# Patient Record
Sex: Female | Born: 1950 | Race: White | Hispanic: No | Marital: Married | Smoking: Never smoker
Health system: Southern US, Community
[De-identification: ages and names within clinical notes are randomized; demographics above are authoritative.]

## PROBLEM LIST (undated history)

## (undated) DIAGNOSIS — J45909 Unspecified asthma, uncomplicated: Secondary | ICD-10-CM

## (undated) DIAGNOSIS — I10 Essential (primary) hypertension: Secondary | ICD-10-CM

## (undated) DIAGNOSIS — D869 Sarcoidosis, unspecified: Secondary | ICD-10-CM

## (undated) DIAGNOSIS — R079 Chest pain, unspecified: Secondary | ICD-10-CM

## (undated) DIAGNOSIS — J302 Other seasonal allergic rhinitis: Secondary | ICD-10-CM

## (undated) DIAGNOSIS — Z8739 Personal history of other diseases of the musculoskeletal system and connective tissue: Secondary | ICD-10-CM

## (undated) DIAGNOSIS — F329 Major depressive disorder, single episode, unspecified: Secondary | ICD-10-CM

## (undated) DIAGNOSIS — M47814 Spondylosis without myelopathy or radiculopathy, thoracic region: Secondary | ICD-10-CM

## (undated) DIAGNOSIS — K589 Irritable bowel syndrome without diarrhea: Secondary | ICD-10-CM

## (undated) DIAGNOSIS — E559 Vitamin D deficiency, unspecified: Secondary | ICD-10-CM

## (undated) DIAGNOSIS — E119 Type 2 diabetes mellitus without complications: Secondary | ICD-10-CM

## (undated) DIAGNOSIS — R233 Spontaneous ecchymoses: Secondary | ICD-10-CM

## (undated) DIAGNOSIS — Z9889 Other specified postprocedural states: Secondary | ICD-10-CM

## (undated) DIAGNOSIS — K219 Gastro-esophageal reflux disease without esophagitis: Secondary | ICD-10-CM

## (undated) DIAGNOSIS — G4733 Obstructive sleep apnea (adult) (pediatric): Secondary | ICD-10-CM

## (undated) DIAGNOSIS — M722 Plantar fascial fibromatosis: Secondary | ICD-10-CM

## (undated) DIAGNOSIS — E785 Hyperlipidemia, unspecified: Secondary | ICD-10-CM

## (undated) DIAGNOSIS — R112 Nausea with vomiting, unspecified: Secondary | ICD-10-CM

## (undated) DIAGNOSIS — J4550 Severe persistent asthma, uncomplicated: Secondary | ICD-10-CM

## (undated) DIAGNOSIS — R238 Other skin changes: Secondary | ICD-10-CM

## (undated) HISTORY — DX: Other skin changes: R23.8

## (undated) HISTORY — DX: Unspecified asthma, uncomplicated: J45.909

## (undated) HISTORY — DX: Chest pain, unspecified: R07.9

## (undated) HISTORY — DX: Plantar fascial fibromatosis: M72.2

## (undated) HISTORY — PX: LESION EXCISION: SHX5167

## (undated) HISTORY — DX: Other seasonal allergic rhinitis: J30.2

## (undated) HISTORY — PX: BREAST BIOPSY: SHX20

## (undated) HISTORY — DX: Irritable bowel syndrome, unspecified: K58.9

## (undated) HISTORY — DX: Spontaneous ecchymoses: R23.3

## (undated) HISTORY — DX: Sarcoidosis, unspecified: D86.9

---

## 1979-04-22 HISTORY — PX: TUBAL LIGATION: SHX77

## 1984-04-21 HISTORY — PX: LUMBAR SPINE SURGERY: SHX701

## 1984-04-21 HISTORY — PX: OTHER SURGICAL HISTORY: SHX169

## 1996-04-21 HISTORY — PX: MEDIASTINOSCOPY: SUR861

## 1999-04-22 HISTORY — PX: CARPAL TUNNEL RELEASE: SHX101

## 2010-06-20 ENCOUNTER — Ambulatory Visit: Payer: Self-pay | Admitting: Otolaryngology

## 2013-01-26 ENCOUNTER — Encounter: Payer: Self-pay | Admitting: Podiatrist

## 2013-01-27 ENCOUNTER — Encounter: Payer: Self-pay | Admitting: Podiatrist

## 2013-01-27 ENCOUNTER — Ambulatory Visit (INDEPENDENT_AMBULATORY_CARE_PROVIDER_SITE_OTHER): Payer: BC Managed Care – PPO | Admitting: Podiatrist

## 2013-01-27 VITALS — BP 149/87 | HR 64 | Temp 98.3°F | Resp 16 | Ht 64.0 in | Wt 212.0 lb

## 2013-01-27 DIAGNOSIS — M799 Soft tissue disorder, unspecified: Secondary | ICD-10-CM

## 2013-01-27 NOTE — Progress Notes (Signed)
Subjective:  Patient presents for post op follow up regarding removal of lesions left third and fourth toes. Patient states the left third toe plantarly is doing great however the left fourth toe she feels that lesion may have returned. Patient states it's mildly uncomfortable however she is able to tolerate it at this time Objective: Her vascular status is intact and unchanged. Excellent appearance plantarly the third toe is noted. Slight lesion palpated plantar lateral aspect of left fourth toe which does appear to be the head of the proximal phalanx when plantarflexed. Appears to be a friction lesion at this time. Assessment status post removal soft tissue lesions left third and fourth toes plantarly Plan: Recommended use of an offloading pad on the left fourth toe. Discussed that if lesion returns would most likely need an arthroplasty to straighten the toe and decreased range of motion in friction from the proximal phalanx. She will be seen back on as-needed basis if she has any problems or concerns she'll let me know.  Marlowe Aschoff DPM

## 2013-06-23 ENCOUNTER — Ambulatory Visit: Payer: Self-pay

## 2015-02-12 IMAGING — CR DG CHEST 2V
1 series · 2 of 2 positions shown · non-contrast
Comparison: None.

CLINICAL DATA: Cough, chest tightness

EXAM:
CHEST  2 VIEW

[Series 1: pa · 0.17mm/px · 2 of 2 slices shown]
[im 1/2]
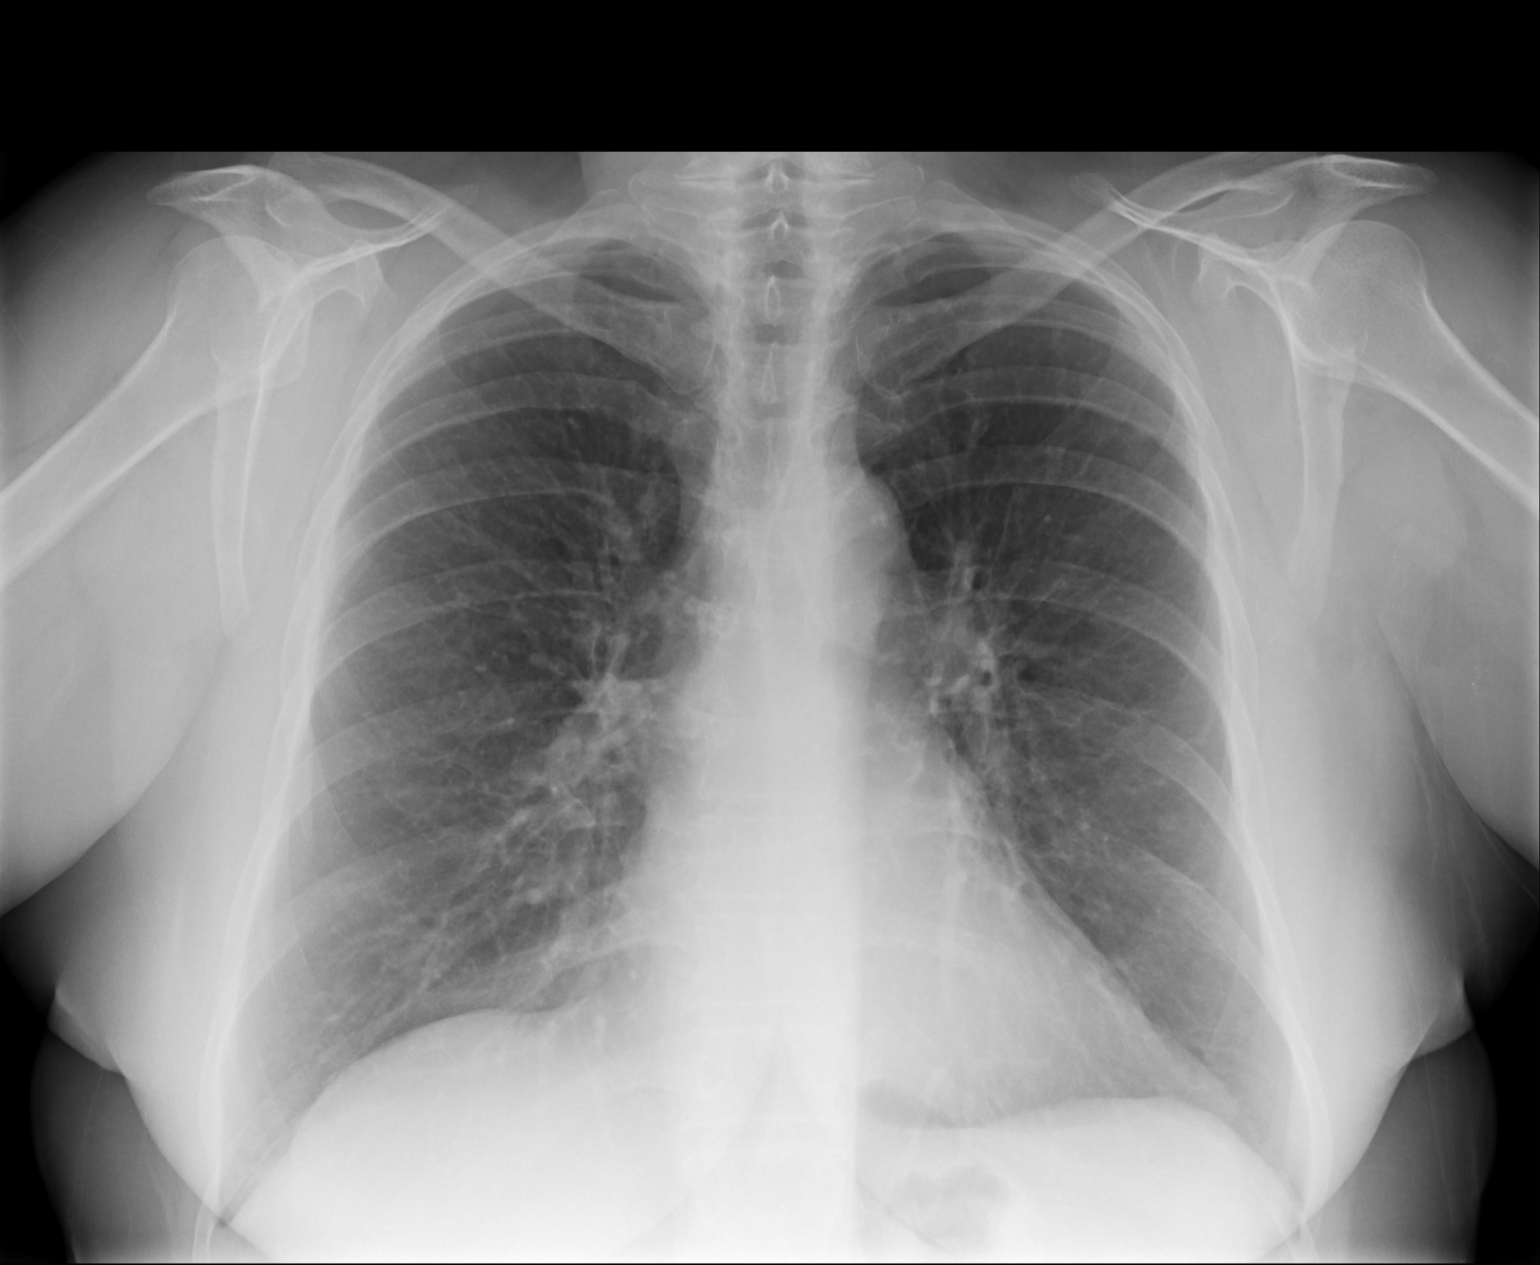
[im 2/2]
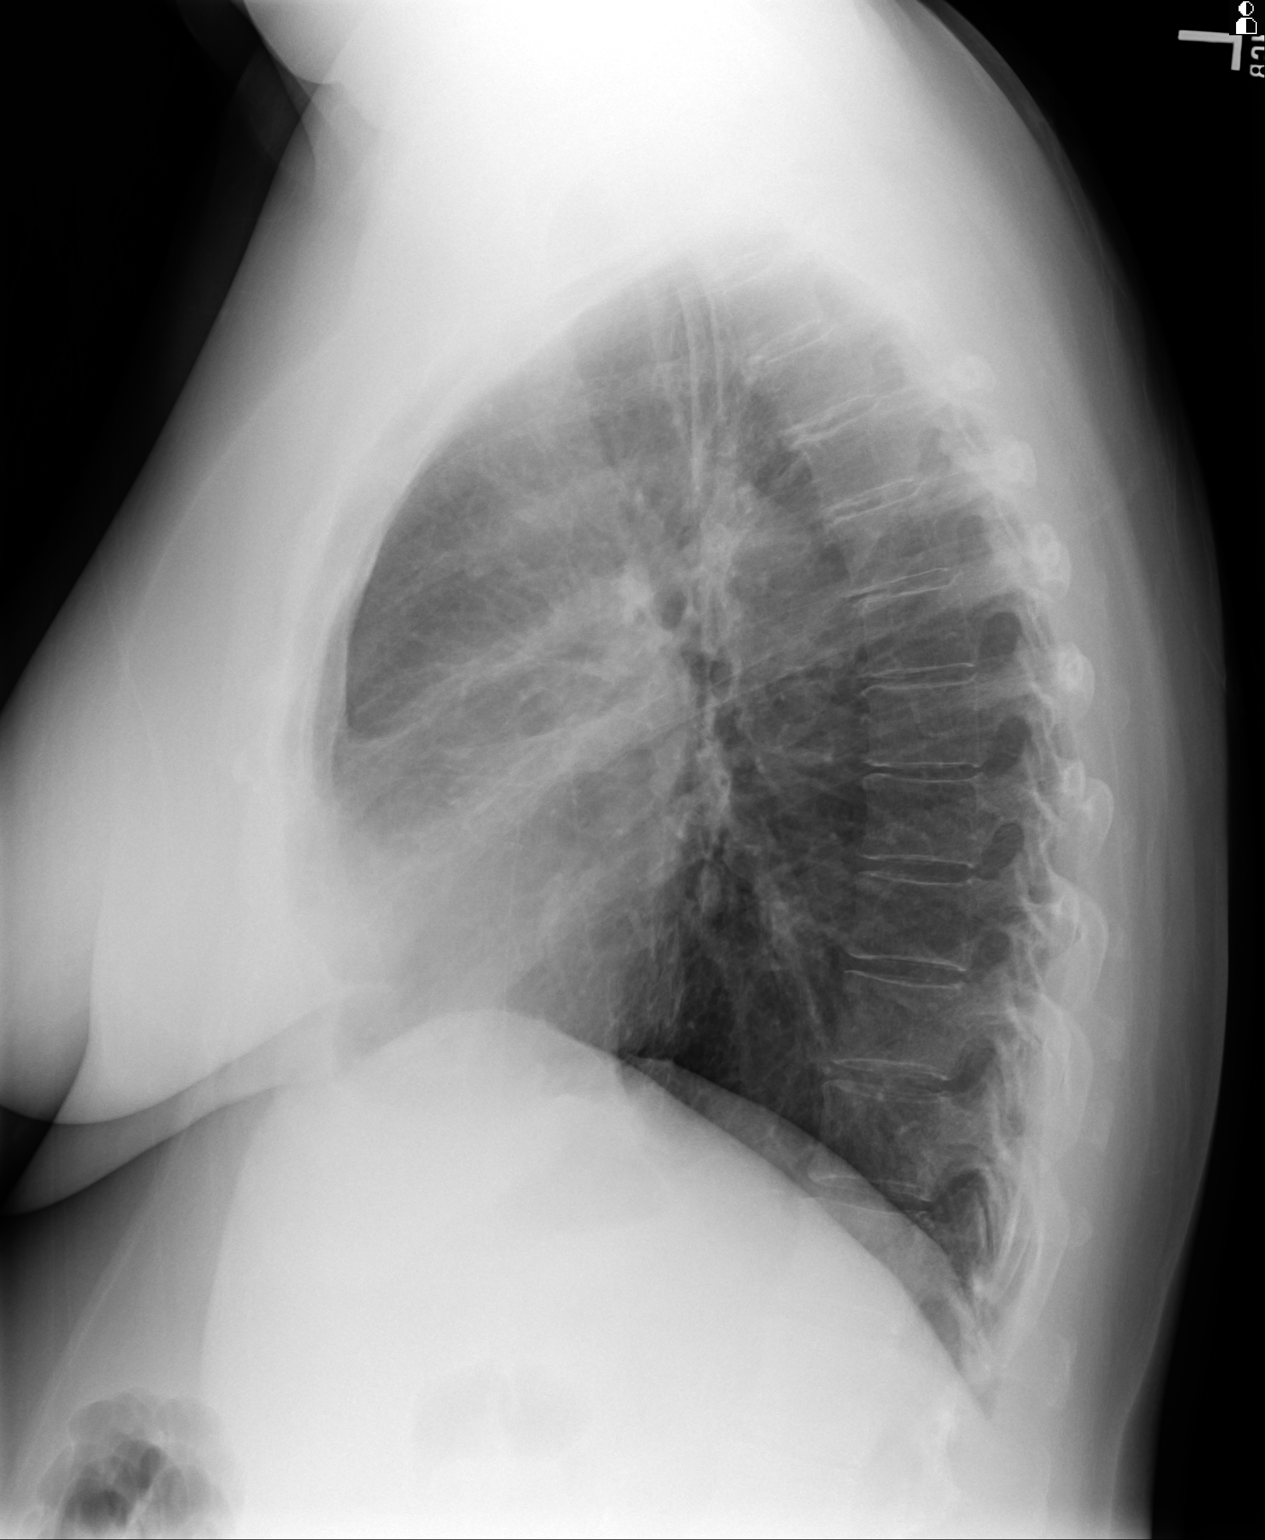

[2 of 2 positions shown; findings below may reference images not displayed]

FINDINGS: The heart size and mediastinal contours are within normal limits.
Both lungs are clear. The visualized skeletal structures are
unremarkable.
IMPRESSION: No active cardiopulmonary disease.

## 2017-04-21 HISTORY — PX: CERVICAL FUSION: SHX112

## 2021-02-19 DIAGNOSIS — J189 Pneumonia, unspecified organism: Secondary | ICD-10-CM

## 2021-02-19 HISTORY — DX: Pneumonia, unspecified organism: J18.9

## 2022-04-23 ENCOUNTER — Other Ambulatory Visit: Payer: Self-pay | Admitting: Orthopedic Surgery

## 2022-04-24 ENCOUNTER — Encounter
Admission: RE | Admit: 2022-04-24 | Discharge: 2022-04-24 | Disposition: A | Discharge status: Home / Self Care | Payer: Medicare Other | Source: Ambulatory Visit | Attending: Orthopedic Surgery | Admitting: Orthopedic Surgery

## 2022-04-24 VITALS — Ht 64.0 in | Wt 220.0 lb

## 2022-04-24 DIAGNOSIS — Z01812 Encounter for preprocedural laboratory examination: Secondary | ICD-10-CM

## 2022-04-24 DIAGNOSIS — I10 Essential (primary) hypertension: Secondary | ICD-10-CM

## 2022-04-24 DIAGNOSIS — E119 Type 2 diabetes mellitus without complications: Secondary | ICD-10-CM

## 2022-04-24 HISTORY — DX: Major depressive disorder, single episode, unspecified: F32.9

## 2022-04-24 HISTORY — DX: Essential (primary) hypertension: I10

## 2022-04-24 HISTORY — DX: Type 2 diabetes mellitus without complications: E11.9

## 2022-04-24 HISTORY — DX: Hyperlipidemia, unspecified: E78.5

## 2022-04-24 HISTORY — DX: Gastro-esophageal reflux disease without esophagitis: K21.9

## 2022-04-24 HISTORY — DX: Other specified postprocedural states: Z98.890

## 2022-04-24 HISTORY — DX: Vitamin D deficiency, unspecified: E55.9

## 2022-04-24 HISTORY — DX: Spondylosis without myelopathy or radiculopathy, thoracic region: M47.814

## 2022-04-24 HISTORY — DX: Severe persistent asthma, uncomplicated: J45.50

## 2022-04-24 HISTORY — DX: Personal history of other diseases of the musculoskeletal system and connective tissue: Z87.39

## 2022-04-24 HISTORY — DX: Nausea with vomiting, unspecified: R11.2

## 2022-04-24 HISTORY — DX: Obstructive sleep apnea (adult) (pediatric): G47.33

## 2022-04-24 NOTE — Patient Instructions (Addendum)
Your procedure is scheduled on: Thursday, January 18 Report to the Registration Desk on the 1st floor of the [facility]. To find out your arrival time, please call [phone] between 1PM - 3PM on: Wednesday, January 17 If your arrival time is 6:00 am, do not arrive prior to that time as the [facility] entrance doors do not open until 6:00 am.  REMEMBER: Instructions that are not followed completely may result in serious medical risk, up to and including death; or upon the discretion of your surgeon and anesthesiologist your surgery may need to be rescheduled.  Do not eat or drink after midnight the night before surgery.  No gum chewing, lozengers or hard candies.  TAKE THESE MEDICATIONS THE MORNING OF SURGERY WITH A SIP OF WATER:  Amlodipine Trelegy inhaler Gabapentin Duoneb nebulizer Omeprazole (Prilosec) - (take one the night before and one on the morning of surgery - helps to prevent nausea after surgery.)  Use inhalers on the day of surgery and bring your albuterol inhaler to the hospital.  Metformin - hold for 2 days prior to surgery. Last day to take metformin is Monday, January 15. Resume AFTER surgery.  One week prior to surgery: starting January 11 Stop Anti-inflammatories (NSAIDS) such as Advil, Aleve, Ibuprofen, Motrin, Naproxen, Naprosyn and Aspirin based products such as Excedrin, Goodys Powder, BC Powder. Stop ANY OVER THE COUNTER supplements until after surgery. Stop coenzyme Q10, cranberry, probiotic You may however, continue to take Tylenol if needed for pain up until the day of surgery.  Continue all other prescribed medication up until the day of surgery.  No Alcohol for 24 hours before or after surgery.  No Smoking including e-cigarettes for 24 hours prior to surgery.  No chewable tobacco products for at least 6 hours prior to surgery.  No nicotine patches on the day of surgery.  Do not use any "recreational" drugs for at least a week prior to your  surgery.  Please be advised that the combination of cocaine and anesthesia may have negative outcomes, up to and including death. If you test positive for cocaine, your surgery will be cancelled.  On the morning of surgery brush your teeth with toothpaste and water, you may rinse your mouth with mouthwash if you wish. Do not swallow any toothpaste or mouthwash.  Use CHG Soap as directed on instruction sheet.  Do not wear jewelry, make-up, hairpins, clips or nail polish.  Do not wear lotions, powders, or perfumes.   Do not shave body from the neck down 48 hours prior to surgery just in case you cut yourself which could leave a site for infection.  Also, freshly shaved skin may become irritated if using the CHG soap.  Contact lenses, hearing aids and dentures may not be worn into surgery.  Do not bring valuables to the hospital. [facility] is not responsible for any missing/lost belongings or valuables.   Bring your C-PAP to the hospital with you in case you may have to spend the night.   Notify your doctor if there is any change in your medical condition (cold, fever, infection).  Wear comfortable clothing (specific to your surgery type) to the hospital.  After surgery, you can help prevent lung complications by doing breathing exercises.  Take deep breaths and cough every 1-2 hours. Your doctor may order a device called an Incentive Spirometer to help you take deep breaths.  If you are being discharged the day of surgery, you will not be allowed to drive home.  You will need a responsible adult (18 years or older) to drive you home and stay with you that night.   If you are taking public transportation, you will need to have a responsible adult (18 years or older) with you. Please confirm with your physician that it is acceptable to use public transportation.   Please call the [facility] at [phone] if you have any questions about these  instructions.  Surgery Visitation Policy:  Patients undergoing a surgery or procedure may have two family members or support persons with them as long as the person is not COVID-19 positive or experiencing its symptoms.      Preparing for Surgery with CHLORHEXIDINE GLUCONATE (CHG) Soap  Chlorhexidine Gluconate (CHG) Soap  o An antiseptic cleaner that kills germs and bonds with the skin to continue killing germs even after washing  o Used for showering the night before surgery and morning of surgery  Before surgery, you can play an important role by reducing the number of germs on your skin.  CHG (Chlorhexidine gluconate) soap is an antiseptic cleanser which kills germs and bonds with the skin to continue killing germs even after washing.  Please do not use if you have an allergy to CHG or antibacterial soaps. If your skin becomes reddened/irritated stop using the CHG.  1. Shower the NIGHT BEFORE SURGERY and the MORNING OF SURGERY with CHG soap.  2. If you choose to wash your hair, wash your hair first as usual with your normal shampoo.  3. After shampooing, rinse your hair and body thoroughly to remove the shampoo.  4. Use CHG as you would any other liquid soap. You can apply CHG directly to the skin and wash gently with a scrungie or a clean washcloth.  5. Apply the CHG soap to your body only from the neck down. Do not use on open wounds or open sores. Avoid contact with your eyes, ears, mouth, and genitals (private parts). Wash face and genitals (private parts) with your normal soap.  6. Wash thoroughly, paying special attention to the area where your surgery will be performed.  7. Thoroughly rinse your body with warm water.  8. Do not shower/wash with your normal soap after using and rinsing off the CHG soap.  9. Pat yourself dry with a clean towel.  10. Wear clean pajamas to bed the night before surgery.  12. Place clean sheets on your bed the night of your first shower  and do not sleep with pets.  13. Shower again with the CHG soap on the day of surgery prior to arriving at the hospital.  14. Do not apply any deodorants/lotions/powders.  15. Please wear clean clothes to the hospital.

## 2022-04-28 ENCOUNTER — Encounter
Admission: RE | Admit: 2022-04-28 | Discharge: 2022-04-28 | Disposition: A | Discharge status: Home / Self Care | Payer: Medicare Other | Source: Ambulatory Visit | Attending: Orthopedic Surgery | Admitting: Orthopedic Surgery

## 2022-04-28 DIAGNOSIS — E119 Type 2 diabetes mellitus without complications: Secondary | ICD-10-CM | POA: Diagnosis not present

## 2022-04-28 DIAGNOSIS — Z01812 Encounter for preprocedural laboratory examination: Secondary | ICD-10-CM

## 2022-04-28 DIAGNOSIS — I10 Essential (primary) hypertension: Secondary | ICD-10-CM | POA: Diagnosis not present

## 2022-04-28 LAB — CBC
HCT: 36.8 % (ref 36.0–46.0)
Hemoglobin: 12.1 g/dL (ref 12.0–15.0)
MCH: 32 pg (ref 26.0–34.0)
MCHC: 32.9 g/dL (ref 30.0–36.0)
MCV: 97.4 fL (ref 80.0–100.0)
Platelets: 322 10*3/uL (ref 150–400)
RBC: 3.78 MIL/uL — ABNORMAL LOW (ref 3.87–5.11)
RDW: 13.7 % (ref 11.5–15.5)
WBC: 9 10*3/uL (ref 4.0–10.5)
nRBC: 0 % (ref 0.0–0.2)

## 2022-05-07 MED ORDER — CHLORHEXIDINE GLUCONATE CLOTH 2 % EX PADS: 6.0000 | MEDICATED_PAD | Freq: Once | CUTANEOUS | Status: DC

## 2022-05-07 MED ORDER — SODIUM CHLORIDE 0.9 % IV SOLN: INTRAVENOUS | Status: DC

## 2022-05-07 MED ORDER — ACETAMINOPHEN 500 MG PO TABS: 1000.0000 mg | ORAL_TABLET | ORAL | Status: AC

## 2022-05-07 MED ORDER — CHLORHEXIDINE GLUCONATE 0.12 % MT SOLN: 15.0000 mL | Freq: Once | OROMUCOSAL | Status: AC

## 2022-05-07 MED ORDER — ORAL CARE MOUTH RINSE: 15.0000 mL | Freq: Once | OROMUCOSAL | Status: AC

## 2022-05-07 MED ORDER — CEFAZOLIN SODIUM-DEXTROSE 2-4 GM/100ML-% IV SOLN
2.0000 g | INTRAVENOUS | Status: AC
  Administered 2022-05-08: 2 g via INTRAVENOUS

## 2022-05-08 ENCOUNTER — Ambulatory Visit: Payer: Medicare Other | Admitting: Certified Registered"

## 2022-05-08 ENCOUNTER — Ambulatory Visit: Payer: Medicare Other

## 2022-05-08 ENCOUNTER — Other Ambulatory Visit: Payer: Self-pay

## 2022-05-08 ENCOUNTER — Encounter
Admission: RE | Disposition: A | Discharge status: Home / Self Care | Payer: Self-pay | Source: Home / Self Care | Attending: Orthopedic Surgery

## 2022-05-08 ENCOUNTER — Ambulatory Visit
Admission: RE | Admit: 2022-05-08 | Discharge: 2022-05-08 | Disposition: A | Discharge status: Home / Self Care | Payer: Medicare Other | Attending: Orthopedic Surgery | Admitting: Orthopedic Surgery

## 2022-05-08 ENCOUNTER — Encounter: Payer: Self-pay | Admitting: Orthopedic Surgery

## 2022-05-08 DIAGNOSIS — Z7984 Long term (current) use of oral hypoglycemic drugs: Secondary | ICD-10-CM | POA: Insufficient documentation

## 2022-05-08 DIAGNOSIS — J4550 Severe persistent asthma, uncomplicated: Secondary | ICD-10-CM | POA: Diagnosis not present

## 2022-05-08 DIAGNOSIS — M75102 Unspecified rotator cuff tear or rupture of left shoulder, not specified as traumatic: Secondary | ICD-10-CM | POA: Insufficient documentation

## 2022-05-08 DIAGNOSIS — E119 Type 2 diabetes mellitus without complications: Secondary | ICD-10-CM | POA: Diagnosis not present

## 2022-05-08 DIAGNOSIS — K219 Gastro-esophageal reflux disease without esophagitis: Secondary | ICD-10-CM | POA: Insufficient documentation

## 2022-05-08 DIAGNOSIS — M7552 Bursitis of left shoulder: Secondary | ICD-10-CM | POA: Diagnosis not present

## 2022-05-08 DIAGNOSIS — G4733 Obstructive sleep apnea (adult) (pediatric): Secondary | ICD-10-CM | POA: Diagnosis not present

## 2022-05-08 DIAGNOSIS — M25812 Other specified joint disorders, left shoulder: Secondary | ICD-10-CM | POA: Diagnosis not present

## 2022-05-08 DIAGNOSIS — M25312 Other instability, left shoulder: Secondary | ICD-10-CM | POA: Diagnosis not present

## 2022-05-08 DIAGNOSIS — Z9981 Dependence on supplemental oxygen: Secondary | ICD-10-CM | POA: Insufficient documentation

## 2022-05-08 DIAGNOSIS — F32A Depression, unspecified: Secondary | ICD-10-CM | POA: Diagnosis not present

## 2022-05-08 DIAGNOSIS — I10 Essential (primary) hypertension: Secondary | ICD-10-CM | POA: Insufficient documentation

## 2022-05-08 DIAGNOSIS — Z01812 Encounter for preprocedural laboratory examination: Secondary | ICD-10-CM

## 2022-05-08 HISTORY — PX: SHOULDER ARTHROSCOPY WITH OPEN ROTATOR CUFF REPAIR: SHX6092

## 2022-05-08 LAB — GLUCOSE, CAPILLARY
Glucose-Capillary: 113 mg/dL — ABNORMAL HIGH (ref 70–99)
Glucose-Capillary: 149 mg/dL — ABNORMAL HIGH (ref 70–99)

## 2022-05-08 SURGERY — ARTHROSCOPY, SHOULDER WITH REPAIR, ROTATOR CUFF, OPEN
Anesthesia: General | Laterality: Left

## 2022-05-08 MED ORDER — FENTANYL CITRATE PF 50 MCG/ML IJ SOSY: 50.0000 ug | PREFILLED_SYRINGE | Freq: Once | INTRAMUSCULAR | Status: AC

## 2022-05-08 MED ORDER — ROCURONIUM BROMIDE 10 MG/ML (PF) SYRINGE
PREFILLED_SYRINGE | INTRAVENOUS | Status: AC
  Filled 2022-05-08: qty 10

## 2022-05-08 MED ORDER — OXYCODONE HCL 5 MG PO TABS: 5.0000 mg | ORAL_TABLET | ORAL | 0 refills | Status: AC | PRN

## 2022-05-08 MED ORDER — FENTANYL CITRATE (PF) 100 MCG/2ML IJ SOLN: 25.0000 ug | INTRAMUSCULAR | Status: DC | PRN

## 2022-05-08 MED ORDER — PHENYLEPHRINE 80 MCG/ML (10ML) SYRINGE FOR IV PUSH (FOR BLOOD PRESSURE SUPPORT)
PREFILLED_SYRINGE | INTRAVENOUS | Status: AC
  Filled 2022-05-08: qty 10

## 2022-05-08 MED ORDER — RINGERS IRRIGATION IR SOLN
Status: DC | PRN
  Administered 2022-05-08: 6000 mL

## 2022-05-08 MED ORDER — CEFAZOLIN SODIUM-DEXTROSE 2-4 GM/100ML-% IV SOLN
INTRAVENOUS | Status: AC
  Filled 2022-05-08: qty 100

## 2022-05-08 MED ORDER — ACETAMINOPHEN 500 MG PO TABS
ORAL_TABLET | ORAL | Status: AC
  Administered 2022-05-08: 1000 mg via ORAL
  Filled 2022-05-08: qty 2

## 2022-05-08 MED ORDER — ROCURONIUM BROMIDE 100 MG/10ML IV SOLN
INTRAVENOUS | Status: DC | PRN
  Administered 2022-05-08: 60 mg via INTRAVENOUS

## 2022-05-08 MED ORDER — ONDANSETRON HCL 4 MG/2ML IJ SOLN
INTRAMUSCULAR | Status: AC
  Filled 2022-05-08: qty 2

## 2022-05-08 MED ORDER — SUGAMMADEX SODIUM 500 MG/5ML IV SOLN
INTRAVENOUS | Status: AC
  Filled 2022-05-08: qty 5

## 2022-05-08 MED ORDER — MIDAZOLAM HCL 2 MG/2ML IJ SOLN: 1.0000 mg | INTRAMUSCULAR | Status: DC | PRN

## 2022-05-08 MED ORDER — BUPIVACAINE HCL (PF) 0.5 % IJ SOLN
INTRAMUSCULAR | Status: DC | PRN
  Administered 2022-05-08: 10 mL via PERINEURAL

## 2022-05-08 MED ORDER — BUPIVACAINE LIPOSOME 1.3 % IJ SUSP
INTRAMUSCULAR | Status: DC | PRN
  Administered 2022-05-08: 20 mL via PERINEURAL

## 2022-05-08 MED ORDER — PHENYLEPHRINE HCL-NACL 20-0.9 MG/250ML-% IV SOLN
INTRAVENOUS | Status: DC | PRN
  Administered 2022-05-08: 40 ug/min via INTRAVENOUS

## 2022-05-08 MED ORDER — LIDOCAINE HCL (CARDIAC) PF 100 MG/5ML IV SOSY
PREFILLED_SYRINGE | INTRAVENOUS | Status: DC | PRN
  Administered 2022-05-08: 60 mg via INTRAVENOUS

## 2022-05-08 MED ORDER — SUGAMMADEX SODIUM 200 MG/2ML IV SOLN
INTRAVENOUS | Status: DC | PRN
  Administered 2022-05-08: 200 mg via INTRAVENOUS

## 2022-05-08 MED ORDER — LACTATED RINGERS IR SOLN
Status: DC | PRN
  Administered 2022-05-08: 12004 mL

## 2022-05-08 MED ORDER — ONDANSETRON HCL 4 MG PO TABS: 4.0000 mg | ORAL_TABLET | Freq: Three times a day (TID) | ORAL | 0 refills | Status: AC | PRN

## 2022-05-08 MED ORDER — EPINEPHRINE PF 1 MG/ML IJ SOLN
INTRAMUSCULAR | Status: AC
  Filled 2022-05-08: qty 4

## 2022-05-08 MED ORDER — PROPOFOL 10 MG/ML IV BOLUS
INTRAVENOUS | Status: DC | PRN
  Administered 2022-05-08: 150 mg via INTRAVENOUS

## 2022-05-08 MED ORDER — LIDOCAINE HCL (PF) 1 % IJ SOLN
INTRAMUSCULAR | Status: AC
  Filled 2022-05-08: qty 5

## 2022-05-08 MED ORDER — LIDOCAINE HCL (PF) 1 % IJ SOLN
INTRAMUSCULAR | Status: DC | PRN
  Administered 2022-05-08: 4 mL via SUBCUTANEOUS

## 2022-05-08 MED ORDER — FENTANYL CITRATE (PF) 100 MCG/2ML IJ SOLN
INTRAMUSCULAR | Status: AC
  Filled 2022-05-08: qty 2

## 2022-05-08 MED ORDER — BUPIVACAINE LIPOSOME 1.3 % IJ SUSP
INTRAMUSCULAR | Status: AC
  Filled 2022-05-08: qty 20

## 2022-05-08 MED ORDER — ONDANSETRON HCL 4 MG/2ML IJ SOLN
INTRAMUSCULAR | Status: DC | PRN
  Administered 2022-05-08: 4 mg via INTRAVENOUS

## 2022-05-08 MED ORDER — LACTATED RINGERS IV SOLN: INTRAVENOUS | Status: DC | PRN

## 2022-05-08 MED ORDER — DEXAMETHASONE SODIUM PHOSPHATE 10 MG/ML IJ SOLN
INTRAMUSCULAR | Status: AC
  Filled 2022-05-08: qty 1

## 2022-05-08 MED ORDER — EPHEDRINE SULFATE-NACL 50-0.9 MG/10ML-% IV SOSY
PREFILLED_SYRINGE | INTRAVENOUS | Status: DC | PRN
  Administered 2022-05-08: 5 mg via INTRAVENOUS
  Administered 2022-05-08: 10 mg via INTRAVENOUS

## 2022-05-08 MED ORDER — FENTANYL CITRATE (PF) 100 MCG/2ML IJ SOLN
INTRAMUSCULAR | Status: DC | PRN
  Administered 2022-05-08: 25 ug via INTRAVENOUS
  Administered 2022-05-08: 50 ug via INTRAVENOUS

## 2022-05-08 MED ORDER — LIDOCAINE HCL (PF) 1 % IJ SOLN
INTRAMUSCULAR | Status: AC
  Filled 2022-05-08: qty 30

## 2022-05-08 MED ORDER — LIDOCAINE HCL (PF) 2 % IJ SOLN
INTRAMUSCULAR | Status: AC
  Filled 2022-05-08: qty 5

## 2022-05-08 MED ORDER — MIDAZOLAM HCL 2 MG/2ML IJ SOLN
INTRAMUSCULAR | Status: AC
  Administered 2022-05-08: 1 mg via INTRAVENOUS
  Filled 2022-05-08: qty 2

## 2022-05-08 MED ORDER — PHENYLEPHRINE HCL-NACL 20-0.9 MG/250ML-% IV SOLN
INTRAVENOUS | Status: AC
  Filled 2022-05-08: qty 250

## 2022-05-08 MED ORDER — BUPIVACAINE HCL (PF) 0.25 % IJ SOLN
INTRAMUSCULAR | Status: AC
  Filled 2022-05-08: qty 30

## 2022-05-08 MED ORDER — FENTANYL CITRATE PF 50 MCG/ML IJ SOSY
PREFILLED_SYRINGE | INTRAMUSCULAR | Status: AC
  Administered 2022-05-08: 50 ug via INTRAVENOUS
  Filled 2022-05-08: qty 1

## 2022-05-08 MED ORDER — PHENYLEPHRINE 80 MCG/ML (10ML) SYRINGE FOR IV PUSH (FOR BLOOD PRESSURE SUPPORT)
PREFILLED_SYRINGE | INTRAVENOUS | Status: DC | PRN
  Administered 2022-05-08 (×2): 80 ug via INTRAVENOUS
  Administered 2022-05-08: 160 ug via INTRAVENOUS

## 2022-05-08 MED ORDER — DEXAMETHASONE SODIUM PHOSPHATE 10 MG/ML IJ SOLN
INTRAMUSCULAR | Status: DC | PRN
  Administered 2022-05-08: 8 mg via INTRAVENOUS

## 2022-05-08 MED ORDER — BUPIVACAINE HCL (PF) 0.5 % IJ SOLN
INTRAMUSCULAR | Status: AC
  Filled 2022-05-08: qty 10

## 2022-05-08 MED ORDER — CHLORHEXIDINE GLUCONATE 0.12 % MT SOLN
OROMUCOSAL | Status: AC
  Administered 2022-05-08: 15 mL via OROMUCOSAL
  Filled 2022-05-08: qty 15

## 2022-05-08 SURGICAL SUPPLY — 69 items
ADAPTER IRRIG TUBE 2 SPIKE SOL (ADAPTER) ×2 IMPLANT
ADPR TBG 2 SPK PMP STRL ASCP (ADAPTER) ×2
ANCH SUT 2 SUTTK 14.5X3 (Anchor) ×2 IMPLANT
ANCH SUT 5.5 KNTLS PEEK (Orthopedic Implant) ×1 IMPLANT
ANCH SUT Q-FX 2.8 (Anchor) ×1 IMPLANT
ANCHOR ALL-SUT Q-FIX 2.8 (Anchor) ×4 IMPLANT
ANCHOR SUT 5.5 MULTIFIX (Orthopedic Implant) IMPLANT
ANCHOR SUT BIOC ST 3X145 (Anchor) IMPLANT
CANNULA 5.75X7 CRYSTAL CLEAR (CANNULA) ×1 IMPLANT
CANNULA PARTIAL THREAD 2X7 (CANNULA) IMPLANT
CANNULA TWIST IN 8.25X9CM (CANNULA) IMPLANT
CONNECTOR PERFECT PASSER (CONNECTOR) ×1 IMPLANT
COOLER POLAR GLACIER W/PUMP (MISCELLANEOUS) ×1 IMPLANT
DEVICE SUCT BLK HOLE OR FLOOR (MISCELLANEOUS) ×2 IMPLANT
DRAPE 3/4 80X56 (DRAPES) ×1 IMPLANT
DRAPE U-SHAPE 47X51 STRL (DRAPES) ×1 IMPLANT
DURAPREP 26ML APPLICATOR (WOUND CARE) ×3 IMPLANT
ELECT REM PT RETURN 9FT ADLT (ELECTROSURGICAL) ×1
ELECTRODE REM PT RTRN 9FT ADLT (ELECTROSURGICAL) ×1 IMPLANT
GAUZE SPONGE 4X4 12PLY STRL (GAUZE/BANDAGES/DRESSINGS) ×1 IMPLANT
GAUZE XEROFORM 1X8 LF (GAUZE/BANDAGES/DRESSINGS) ×1 IMPLANT
GLOVE BIOGEL PI IND STRL 9 (GLOVE) ×1 IMPLANT
GLOVE BIOGEL PI ORTHO SZ9 (GLOVE) ×6 IMPLANT
GOWN STRL REUS TWL 2XL XL LVL4 (GOWN DISPOSABLE) ×1 IMPLANT
GOWN STRL REUS W/ TWL LRG LVL3 (GOWN DISPOSABLE) ×1 IMPLANT
GOWN STRL REUS W/TWL LRG LVL3 (GOWN DISPOSABLE) ×1
IV LACTATED RINGER IRRG 3000ML (IV SOLUTION) ×6
IV LR IRRIG 3000ML ARTHROMATIC (IV SOLUTION) ×8 IMPLANT
KIT STABILIZATION SHOULDER (MISCELLANEOUS) ×1 IMPLANT
KIT SUTURE 2.8 Q-FIX DISP (MISCELLANEOUS) ×1 IMPLANT
KIT SUTURETAK 3.0 INSERT PERC (KITS) IMPLANT
KIT TURNOVER KIT A (KITS) ×1 IMPLANT
MANIFOLD NEPTUNE II (INSTRUMENTS) ×1 IMPLANT
MASK FACE SPIDER DISP (MASK) ×1 IMPLANT
MAT ABSORB  FLUID 56X50 GRAY (MISCELLANEOUS) ×2
MAT ABSORB FLUID 56X50 GRAY (MISCELLANEOUS) ×2 IMPLANT
NDL SAFETY ECLIP 18X1.5 (MISCELLANEOUS) ×1 IMPLANT
NEEDLE HYPO 22GX1.5 SAFETY (NEEDLE) ×1 IMPLANT
PACK ARTHROSCOPY SHOULDER (MISCELLANEOUS) ×1 IMPLANT
PAD ABD DERMACEA PRESS 5X9 (GAUZE/BANDAGES/DRESSINGS) ×1 IMPLANT
PAD ARMBOARD 7.5X6 YLW CONV (MISCELLANEOUS) ×2 IMPLANT
PAD WRAPON POLAR SHDR XLG (MISCELLANEOUS) ×1 IMPLANT
PASSER SUT FIRSTPASS SELF (INSTRUMENTS) ×1 IMPLANT
SHAVER BLADE BONE CUTTER 4.5 (BLADE) ×1 IMPLANT
SHAVER BLADE TAPERED BLUNT 4 (BLADE) ×1 IMPLANT
SLEEVE REMOTE CONTROL 5X12 (DRAPES) ×1 IMPLANT
SPONGE T-LAP 18X18 ~~LOC~~+RFID (SPONGE) ×1 IMPLANT
STRIP CLOSURE SKIN 1/2X4 (GAUZE/BANDAGES/DRESSINGS) ×1 IMPLANT
SUT ETHILON 4-0 (SUTURE) ×1
SUT ETHILON 4-0 FS2 18XMFL BLK (SUTURE) ×1
SUT LASSO 90 DEG SD STR (SUTURE) IMPLANT
SUT MNCRL 4-0 (SUTURE) ×1
SUT MNCRL 4-0 27XMFL (SUTURE) ×1
SUT PDS AB 0 CT1 27 (SUTURE) ×3 IMPLANT
SUT PERFECTPASSER WHITE CART (SUTURE) ×4 IMPLANT
SUT SMART STITCH CARTRIDGE (SUTURE) ×4 IMPLANT
SUT ULTRABRAID 2 COBRAID 38 (SUTURE) IMPLANT
SUT VIC AB 0 CT1 36 (SUTURE) ×3 IMPLANT
SUT VIC AB 2-0 CT2 27 (SUTURE) ×1 IMPLANT
SUTURE ETHLN 4-0 FS2 18XMF BLK (SUTURE) ×1 IMPLANT
SUTURE MNCRL 4-0 27XMF (SUTURE) ×1 IMPLANT
SYR 10ML LL (SYRINGE) ×1 IMPLANT
TAPE MICROFOAM 4IN (TAPE) ×1 IMPLANT
TRAP FLUID SMOKE EVACUATOR (MISCELLANEOUS) ×1 IMPLANT
TUBING CONNECTING 10 (TUBING) ×1 IMPLANT
TUBING INFLOW SET DBFLO PUMP (TUBING) ×1 IMPLANT
TUBING OUTFLOW SET DBLFO PUMP (TUBING) ×1 IMPLANT
WAND WEREWOLF FLOW 90D (MISCELLANEOUS) ×1 IMPLANT
WRAPON POLAR PAD SHDR XLG (MISCELLANEOUS) ×1

## 2022-05-08 NOTE — Transfer of Care (Signed)
Immediate Anesthesia Transfer of Care Note  Patient: Jaelani Posa  Procedure(s) Performed: SHOULDER ARTHROSCOPY WITH OPEN ROTATOR CUFF REPAIR (Left)  Patient Location: PACU  Anesthesia Type:General  Level of Consciousness: drowsy  Airway & Oxygen Therapy: Patient Spontanous Breathing and Patient connected to face mask oxygen  Post-op Assessment: Report given to RN and Post -op Vital signs reviewed and stable  Post vital signs: Reviewed and stable  Last Vitals:  Vitals Value Taken Time  BP 91/48   Temp    Pulse 73 05/08/22 1439  Resp 17 05/08/22 1439  SpO2 94 % 05/08/22 1439  Vitals shown include unvalidated device data.  Last Pain:  Vitals:   05/08/22 1117  TempSrc: Temporal  PainSc: 4          Complications: No notable events documented.

## 2022-05-08 NOTE — Op Note (Signed)
05/08/2022  2:52 PM  PATIENT:  Catherine Bowen  72 y.o. female  PRE-OPERATIVE DIAGNOSIS:  Left Shoulder Rotator Cuff Tear  POST-OPERATIVE DIAGNOSIS:  Left Shoulder Rotator Cuff Tear, anterior inferior labral tear with shoulder anterior instability, subacromial impingement and distal clavicle excision  PROCEDURE:  Procedure(s): Left shoulder arthroscopic anterior labral repair, subacromial decompression, distal clavicle excision and mini open rotator cuff repair.  SURGEON:  Surgeon(s) and Role:    * Juanell Fairly, MD - Primary  ANESTHESIA:   general and paracervical block   PREOPERATIVE INDICATIONS:  [name] is a  72 y.o. female with a diagnosis of left rotator cuff tear confirmed by MRI who failed nonoperative management and elected to proceed with surgical repair of her rotator cuff tear.    The risks benefits and alternatives were discussed with the patient preoperatively including but not limited to the risks of infection, bleeding, nerve injury, persistent pain or weakness, shoulder stiffness/arthrofibrosis, failure of the repair, re-tear of the rotator cuff and the need for further surgery. Medical risks include DVT and pulmonary embolism, myocardial infarction, stroke, pneumonia, respiratory failure and death. Patient understood these risks and wished to proceed.  OPERATIVE IMPLANTS:  Labral repair: Arthrex bio suture tack anchors x 2 Rotator cuff repair: Smith & Nephew MultiFix anchor x 1 & Smith & Nephew Q Fix anchor x 1  OPERATIVE FINDINGS:  High-grade partial-thickness tear of the supraspinatus without retraction.  Biceps tendon was intact.  Subscapularis tendon was intact.  Patient had no SLAP tear but had a diminutive anterior labrum with instability of the humeral head anteriorly with manual translation.  Patient had extensive subacromial bursitis and spurring with acomioclavicular joint arthrosis.  OPERATIVE PROCEDURE: The patient was met in the preoperative  area. The left shoulder was signed with the word yes and my initials according the hospital's correct site of surgery protocol.   A pre-op history and physical was performed at the bedside.  The patient underwent an interscalene block with Exparel by the anesthesia service.  Patient was brought to the operating room where she underwent general anesthesia.  The patient was placed in a beachchair position.  A spider arm positioner was used for this case.  Examination under anesthesia revealed no loss of passive range of motion, but increased anterior translation with load shift testing. The patient had a negative sulcus sign.  Patient was prepped and draped in a sterile fashion. A timeout was performed to verify the patient's name, date of birth, medical record number, correct site of surgery and correct procedure to be performed there was also used to verify the patient received antibiotics that all appropriate instruments, implants and radiographs studies were available in the room. Once all in attendance were in agreement case began.  Patient received ancef 2 grams IV for pre-op antibiotics.  Bony landmarks were drawn out with a surgical marker along with proposed arthroscopy incisions.  An 11 blade was used to establish a posterior portal through which the arthroscope was placed in the glenohumeral joint. A full diagnostic examination of the shoulder was performed.  She was found to have fraying of the superior labrum which was debrided using a tapered shaver blade.  The anterior labrum was found to be torn at the anterior inferior margin from approximately 6:00 to 9:00.  The hook probe could be inserted in the interval between the labrum and the glenoid.  This appeared to be a chronic tear.  Patient had increased anterior translation of the humeral head with manual manipulation.  The decision was made to repair the labrum.  The second anterior lateral portal was placed with a 7 mm arthroscopic cannula.  The  labrum was mobilized with an arthroscopic elevator.  The interval between the labrum and glenoid was debrided with a tapered shaver blade.  The anterior glenoid was then burred with the tapered shaver blade until punctate bleeding was identified.  Two Arthrex bio suture tak anchors were then placed sequentially at the 5:00 and 3:00 positions.  A 90 degree suture lasso was used to shuttle 1 limb of each of the anchors under the labrum.  Arthroscopic knot tying technique was used to repair the inferior labrum to the glenoid.  Renal arthroscopic images of the labral repair were taken.  A 0 PDS suture was then placed through the supraspinatus tendon percutaneously for identification from the bursal side.  The arthroscope was then removed and placed in the subacromial space.    Extensive bursitis was encountered and debrided using a Dyonics tapered shaver blade and a 90 Werewolf wand from a lateral portal which was established under direct visualization using an 18-gauge spinal needle. A subacromial decompression was performed sing a 4.5 mm bone cutter shaver blade from the lateral portal.  A distal clavicle excision was then performed through the anterior portal also using the bone cutter shaver blade.  The 0 PDS suture was identified.  A hook probe was used to find the high grade interstitial tear.  The Dyonics tapered shaver blade is used to debride the the remaining fibers of the rotator cuff on the bursal side until the interstitial tear was identified.  The bone cutter shaver blade was then used to debride the greater tuberosity of all torn fibers of the rotator cuff. A single Perfect Pass suture was placed in the lateral border of the rotator cuff tear. All arthroscopic instruments were then removed and the mini-open portion of the procedure began.  A saber-type incision was made along the lateral border of the acromion. The deltoid muscle was identified and split in line with its fibers which allowed  visualization of the rotator cuff. The Perfect Pass suture previously placed in the lateral border of the rotator cuff was brought out through the deltoid split.  The lateral edge of the rotator cuff was debrided with a #15 blade until healthy tissue was identified.  A single [employer] anchor was placed at the articular margin of the humeral head with the greater tuberosity. The suture limbs of the Q Fix anchors were passed medially through the rotator cuff using a First Pass suture passer. Two additional Perfect Pass sutures were placed through the lateral border of the rotator cuff.  The three perfect pass sutures were then anchored to the greater tuberosity footprint using a single [employer] Multifix anchor. The sutures passed through the Multifix anchor were then tensioned to allow reduction of the rotator cuff to the greater tuberosity footprint. The medial row repair was then performed using the Q fix sutures, which were tied down using an arthroscopic knot tying technique.  Arthroscopic images of the repair were taken with the arthroscope both externally and from inside the glenohumeral joint.  All incisions were copiously irrigated. The deltoid fascia was repaired using a 0 Vicryl suture.  The subcutaneous tissue of all incisions were closed with a 2-0 Vicryl. Skin closure for the arthroscopic incisions was performed with 4-0 nylon. The skin edges of the saber incision was approximated with a running 4-0 undyed Monocryl.  A dry sterile dressing was applied. The patient was placed in an abduction sling and a Polar Care was applied to the shoulder.  All sharp, sponge and it instrument counts were correct at the conclusion of the case. I was scrubbed and present for the entire case. I spoke with the patient's husband postoperatively to let him know the case had been performed without complication and the patient was stable in recovery room.

## 2022-05-08 NOTE — Anesthesia Preprocedure Evaluation (Signed)
Anesthesia Evaluation  Patient identified by MRN, date of birth, ID band Patient awake    Reviewed: Allergy & Precautions, H&P , NPO status , Patient's Chart, lab work & pertinent test results, reviewed documented beta blocker date and time   History of Anesthesia Complications (+) PONV and history of anesthetic complications  Airway Mallampati: II  TM Distance: >3 FB Neck ROM: full    Dental  (+) Dental Advidsory Given, Teeth Intact, Caps   Pulmonary neg shortness of breath, asthma , sleep apnea, Continuous Positive Airway Pressure Ventilation and Oxygen sleep apnea , neg COPD, neg recent URI   Pulmonary exam normal breath sounds clear to auscultation       Cardiovascular Exercise Tolerance: Good hypertension, (-) angina (-) Past MI and (-) Cardiac Stents Normal cardiovascular exam(-) dysrhythmias (-) Valvular Problems/Murmurs Rhythm:regular Rate:Normal     Neuro/Psych  PSYCHIATRIC DISORDERS  Depression    negative neurological ROS     GI/Hepatic Neg liver ROS,GERD  ,,  Endo/Other  diabetes, Well Controlled, Type 2, Oral Hypoglycemic Agents    Renal/GU negative Renal ROS  negative genitourinary   Musculoskeletal   Abdominal   Peds  Hematology negative hematology ROS (+)   Anesthesia Other Findings Past Medical History: No date: Asthma No date: Bruises easily No date: Chest pain No date: GERD (gastroesophageal reflux disease) No date: History of degenerative disc disease No date: Hyperlipidemia No date: IBS (irritable bowel syndrome) No date: Major depressive disorder No date: Obstructive sleep apnea on CPAP     Comment:  with 2 liters oxygen No date: Plantar fasciitis 02/2021: Pneumonia No date: PONV (postoperative nausea and vomiting) No date: Primary hypertension No date: Sarcoidosis No date: Seasonal allergies No date: Severe persistent asthma No date: Thoracic spondylosis No date: Type 2 diabetes  mellitus (HCC) No date: Vitamin D insufficiency   Reproductive/Obstetrics negative OB ROS                             Anesthesia Physical Anesthesia Plan  ASA: 3  Anesthesia Plan: General   Post-op Pain Management: Regional block*   Induction: Intravenous  PONV Risk Score and Plan: 4 or greater and Ondansetron, Dexamethasone, Treatment may vary due to age or medical condition and Droperidol  Airway Management Planned: Oral ETT  Additional Equipment:   Intra-op Plan:   Post-operative Plan: Extubation in OR  Informed Consent: I have reviewed the patients History and Physical, chart, labs and discussed the procedure including the risks, benefits and alternatives for the proposed anesthesia with the patient or authorized representative who has indicated his/her understanding and acceptance.     Dental Advisory Given  Plan Discussed with: Anesthesiologist, CRNA and Surgeon  Anesthesia Plan Comments:        Anesthesia Quick Evaluation

## 2022-05-08 NOTE — Discharge Instructions (Signed)
AMBULATORY SURGERY  ?DISCHARGE INSTRUCTIONS ? ? ?The drugs that you were given will stay in your system until tomorrow so for the next 24 hours you should not: ? ?Drive an automobile ?Make any legal decisions ?Drink any alcoholic beverage ? ? ?You may resume regular meals tomorrow.  Today it is better to start with liquids and gradually work up to solid foods. ? ?You may eat anything you prefer, but it is better to start with liquids, then soup and crackers, and gradually work up to solid foods. ? ? ?Please notify your doctor immediately if you have any unusual bleeding, trouble breathing, redness and pain at the surgery site, drainage, fever, or pain not relieved by medication. ? ? ? ?Additional Instructions: ? ? ? ?Please contact your physician with any problems or Same Day Surgery at [phone], Monday through Friday 6 am to 4 pm, or [place] [facility] Main number at [phone].  ?

## 2022-05-08 NOTE — Anesthesia Procedure Notes (Signed)
Anesthesia Regional Block: Interscalene brachial plexus block   Pre-Anesthetic Checklist: , timeout performed,  Correct Patient, Correct Site, Correct Laterality,  Correct Procedure, Correct Position, site marked,  Risks and benefits discussed,  Surgical consent,  Pre-op evaluation,  At surgeon's request and post-op pain management  Laterality: Left and Upper  Prep: chloraprep       Needles:  Injection technique: Single-shot  Needle Type: Stimiplex     Needle Length: 5cm  Needle Gauge: 22     Additional Needles:   Procedures:,,,, ultrasound used (permanent image in chart),,    Narrative:  Start time: 05/08/2022 11:36 AM End time: 05/08/2022 11:38 AM Injection made incrementally with aspirations every 5 mL.  Performed by: Personally  Anesthesiologist: Martha Clan, MD  Additional Notes: Functioning IV was confirmed and monitors were applied.  A 37mm 22ga Stimuplex needle was used. Sterile prep and drape,hand hygiene and sterile gloves were used.  Negative aspiration and negative test dose prior to incremental administration of local anesthetic. The patient tolerated the procedure well.

## 2022-05-08 NOTE — H&P (Signed)
PREOPERATIVE H&P  Chief Complaint: Left Shoulder Rotator Cuff Tear  HPI: Catherine Bowen is a 72 y.o. female who presents for preoperative history and physical with a diagnosis of Left Shoulder Rotator Cuff Tear. Symptoms of left shoulder pain, weakness and mid range of motion are significantly impairing activities of daily living.  She has failed nonoperative management wished to proceed with surgical fixation of her rotator cuff tear.   Past Medical History:  Diagnosis Date   Asthma    Bruises easily    Chest pain    GERD (gastroesophageal reflux disease)    History of degenerative disc disease    Hyperlipidemia    IBS (irritable bowel syndrome)    Major depressive disorder    Obstructive sleep apnea on CPAP    with 2 liters oxygen   Plantar fasciitis    Pneumonia 02/2021   PONV (postoperative nausea and vomiting)    Primary hypertension    Sarcoidosis    Seasonal allergies    Severe persistent asthma    Thoracic spondylosis    Type 2 diabetes mellitus (HCC)    Vitamin D insufficiency    Past Surgical History:  Procedure Laterality Date   BREAST BIOPSY Bilateral    CARPAL TUNNEL RELEASE Right 2001   CERVICAL FUSION  2019   C4-6   LESION EXCISION Left    4th toe   LUMBAR SPINE SURGERY  1986   discectomy   MEDIASTINOSCOPY  1998   TUBAL LIGATION  04/22/1979   Social History   Socioeconomic History   Marital status: Married    Spouse name: Ted   Number of children: 3   Years of education: Not on file   Highest education level: Not on file  Occupational History   Not on file  Tobacco Use   Smoking status: Never   Smokeless tobacco: Never  Vaping Use   Vaping Use: Never used  Substance and Sexual Activity   Alcohol use: No   Drug use: No   Sexual activity: Not on file  Other Topics Concern   Not on file  Social History Narrative   Not on file   Social Determinants of Health   Financial Resource Strain: Not on file  Food Insecurity: Not on file   Transportation Needs: Not on file  Physical Activity: Not on file  Stress: Not on file  Social Connections: Not on file   Family History  Problem Relation Age of Onset   Cancer Mother    Stroke Mother    Gout Mother    Cancer Father    Allergies  Allergen Reactions   Sulfa Antibiotics Rash   Tape Rash    Adhesive ; left on too long   Prior to Admission medications   Medication Sig Start Date End Date Taking? Authorizing Provider  amLODipine (NORVASC) 5 MG tablet Take 5 mg by mouth daily.   Yes [provider]  Fluticasone-Umeclidin-Vilant (TRELEGY ELLIPTA) 200-62.5-25 MCG/ACT AEPB Inhale 1 puff into the lungs daily.   Yes [provider]  gabapentin (NEURONTIN) 300 MG capsule Take 300 mg by mouth 2 (two) times daily.   Yes [provider]  omeprazole (PRILOSEC) 20 MG capsule Take 20 mg by mouth daily as needed.   Yes [provider]  albuterol (VENTOLIN HFA) 108 (90 Base) MCG/ACT inhaler Inhale 2 puffs into the lungs every 4 (four) hours as needed for wheezing or shortness of breath.    [provider]  Carboxymethylcellulose Sodium (THERATEARS OP) Apply  1 drop to eye as needed.    [provider]  cholecalciferol (VITAMIN D3) 25 MCG (1000 UNIT) tablet Take 1,000 Units by mouth daily.    [provider]  Coenzyme Q10 100 MG capsule Take 100 mg by mouth daily.    [provider]  CRANBERRY PO Take 1 tablet by mouth in the morning and at bedtime.    [provider]  EPINEPHrine 0.3 MG/0.3ML SOSY Inject 1 each as directed as needed.    [provider]  escitalopram (LEXAPRO) 10 MG tablet Take 10 mg by mouth at bedtime.    [provider]  fluticasone (FLONASE) 50 MCG/ACT nasal spray Place 2 sprays into both nostrils daily as needed for allergies or rhinitis.    [provider]  ipratropium-albuterol (DUONEB) 0.5-2.5 (3) MG/3ML SOLN Take 3 mLs by nebulization 4 (four) times daily.     [provider]  Lactobacillus (PROBIOTIC ACIDOPHILUS PO) Take 1 capsule by mouth daily.    [provider]  loratadine (CLARITIN) 10 MG tablet Take 10 mg by mouth 2 (two) times daily.    [provider]  losartan (COZAAR) 50 MG tablet Take 50 mg by mouth daily.    [provider]  metFORMIN (GLUCOPHAGE) 500 MG tablet Take 500 mg by mouth 2 (two) times daily with a meal.    [provider]  OMALIZUMAB [place] Inject 300 mg into the skin every 28 (twenty-eight) days.    [provider]  Polyethyl Glycol-Propyl Glycol (SYSTANE) 0.4-0.3 % GEL ophthalmic gel Place 1 Application into both eyes at bedtime.    [provider]  simvastatin (ZOCOR) 20 MG tablet Take 20 mg by mouth at bedtime.    [provider]  traMADol (ULTRAM) 50 MG tablet Take 50 mg by mouth every 8 (eight) hours as needed.    [provider]  Wheat Dextrin (BENEFIBER PO) Take 1 Dose by mouth daily.    [provider]     Positive ROS: All other systems have been reviewed and were otherwise negative with the exception of those mentioned in the HPI and as above.  Physical Exam: General: Alert, no acute distress Cardiovascular: Regular rate and rhythm, no murmurs rubs or gallops.  No pedal edema Respiratory: Clear to auscultation bilaterally, no wheezes rales or rhonchi. No cyanosis, no use of accessory musculature GI: No organomegaly, abdomen is soft and non-tender nondistended with positive bowel sounds. Skin: Skin intact, no lesions within the operative field. Neurologic: Sensation intact distally Psychiatric: Patient is competent for consent with normal mood and affect Lymphatic: No cervical lymphadenopathy  MUSCULOSKELETAL: Left shoulder: Patient can forward elevate and abduct to approximately 150 degrees, but has pain in the mid range of abduction. The patient has pain with a downward directed force on her abducted shoulder and  demonstrates mild weakness to shoulder abduction. She has full digital wrist and elbow range of motion, intact sensation to light touch and a palpable radial pulse. She had positive impingement signs, but no apprehension or instability.  Radiology:  Her MRI has demonstrated a high grade partial thickness tear involving the supraspinatus tendon with subacromial bursitis.  Assessment: Left Shoulder Rotator Cuff Tear  Plan: Plan for Procedure(s): LEFT SHOULDER ARTHROSCOPY WITH MINI-OPEN ROTATOR CUFF REPAIR  I reviewed the details of the operation as well as the postoperative course with the patient.  I marked the left shoulder according to hospital's quickset of surgery protocol.  A preop history and physical was performed at  the bedside.  I answered all her questions.  I discussed the risks and benefits of surgery. The risks include but are not limited to infection, bleeding, nerve or blood vessel injury, joint stiffness or loss of motion, persistent pain, weakness or instability, retear of the rotator cuff, failure of the hardware, Popeye deformity of the biceps, inability to repair the rotator cuff and the need for further surgery. Patient understood these risks and wished to proceed.     Juanell Fairly, MD   05/08/2022 11:09 AM

## 2022-05-08 NOTE — Anesthesia Procedure Notes (Signed)
Procedure Name: Intubation Date/Time: 05/08/2022 11:57 AM  Performed by: Niel Hummer, CRNAPre-anesthesia Checklist: Emergency Drugs available, Patient identified, Suction available and Patient being monitored Patient Re-evaluated:Patient Re-evaluated prior to induction Oxygen Delivery Method: Circle system utilized Preoxygenation: Pre-oxygenation with 100% oxygen Induction Type: IV induction Ventilation: Mask ventilation without difficulty and Oral airway inserted - appropriate to patient size Laryngoscope Size: McGraph and 4 Grade View: Grade I Tube type: Oral Tube size: 7.0 mm Number of attempts: 1 Airway Equipment and Method: Stylet and Video-laryngoscopy Placement Confirmation: ETT inserted through vocal cords under direct vision, positive ETCO2 and breath sounds checked- equal and bilateral Secured at: 21 cm Tube secured with: Tape Dental Injury: Teeth and Oropharynx as per pre-operative assessment  Comments: Elective [facility]

## 2022-05-09 ENCOUNTER — Encounter: Payer: Self-pay | Admitting: Orthopedic Surgery

## 2022-05-09 NOTE — Anesthesia Postprocedure Evaluation (Signed)
Anesthesia Post Note  Patient: Catherine Bowen  Procedure(s) Performed: SHOULDER ARTHROSCOPY WITH OPEN ROTATOR CUFF REPAIR (Left)  Patient location during evaluation: PACU Anesthesia Type: General Level of consciousness: awake and alert Pain management: pain level controlled Vital Signs Assessment: post-procedure vital signs reviewed and stable Respiratory status: spontaneous breathing, nonlabored ventilation, respiratory function stable and patient connected to nasal cannula oxygen Cardiovascular status: blood pressure returned to baseline and stable Postop Assessment: no apparent nausea or vomiting Anesthetic complications: no   No notable events documented.   Last Vitals:  Vitals:   05/08/22 1505 05/08/22 1515  BP:  (!) 101/43  Pulse: 73 70  Resp: 17 14  Temp:  (!) 36.1 C  SpO2: 92% 93%    Last Pain:  Vitals:   05/09/22 0832  TempSrc:   PainSc: 0-No pain                 Martha Clan
